# Patient Record
Sex: Female | Born: 1975 | Marital: Single | Smoking: Former smoker
Health system: Southern US, Community
[De-identification: ages and names within clinical notes are randomized; demographics above are authoritative.]

## PROBLEM LIST (undated history)

## (undated) DIAGNOSIS — K219 Gastro-esophageal reflux disease without esophagitis: Secondary | ICD-10-CM

## (undated) DIAGNOSIS — K589 Irritable bowel syndrome without diarrhea: Secondary | ICD-10-CM

## (undated) DIAGNOSIS — E785 Hyperlipidemia, unspecified: Secondary | ICD-10-CM

## (undated) DIAGNOSIS — T7840XA Allergy, unspecified, initial encounter: Secondary | ICD-10-CM

## (undated) HISTORY — PX: CHOLECYSTECTOMY: SHX55

## (undated) HISTORY — PX: SEPTOPLASTY: SUR1290

## (undated) HISTORY — DX: Gastro-esophageal reflux disease without esophagitis: K21.9

## (undated) HISTORY — PX: OTHER SURGICAL HISTORY: SHX169

## (undated) HISTORY — DX: Allergy, unspecified, initial encounter: T78.40XA

## (undated) HISTORY — PX: ABDOMINAL HYSTERECTOMY: SHX81

## (undated) HISTORY — DX: Irritable bowel syndrome, unspecified: K58.9

## (undated) HISTORY — PX: EYE SURGERY: SHX253

## (undated) HISTORY — DX: Hyperlipidemia, unspecified: E78.5

---

## 2012-05-11 DIAGNOSIS — T2610XA Burn of cornea and conjunctival sac, unspecified eye, initial encounter: Secondary | ICD-10-CM | POA: Insufficient documentation

## 2012-05-11 DIAGNOSIS — T2600XA Burn of unspecified eyelid and periocular area, initial encounter: Secondary | ICD-10-CM | POA: Insufficient documentation

## 2012-06-17 DIAGNOSIS — H18891 Other specified disorders of cornea, right eye: Secondary | ICD-10-CM | POA: Insufficient documentation

## 2012-08-25 DIAGNOSIS — H02059 Trichiasis without entropian unspecified eye, unspecified eyelid: Secondary | ICD-10-CM | POA: Insufficient documentation

## 2013-07-17 DIAGNOSIS — H179 Unspecified corneal scar and opacity: Secondary | ICD-10-CM | POA: Insufficient documentation

## 2013-07-17 DIAGNOSIS — H11811 Pseudopterygium of conjunctiva, right eye: Secondary | ICD-10-CM | POA: Insufficient documentation

## 2013-07-17 DIAGNOSIS — H11239 Symblepharon, unspecified eye: Secondary | ICD-10-CM | POA: Insufficient documentation

## 2013-11-18 DIAGNOSIS — K219 Gastro-esophageal reflux disease without esophagitis: Secondary | ICD-10-CM | POA: Insufficient documentation

## 2013-12-19 DIAGNOSIS — H02019 Cicatricial entropion of unspecified eye, unspecified eyelid: Secondary | ICD-10-CM | POA: Insufficient documentation

## 2016-11-07 DIAGNOSIS — M79671 Pain in right foot: Secondary | ICD-10-CM | POA: Insufficient documentation

## 2016-11-07 DIAGNOSIS — M722 Plantar fascial fibromatosis: Secondary | ICD-10-CM | POA: Insufficient documentation

## 2016-11-07 DIAGNOSIS — M24571 Contracture, right ankle: Secondary | ICD-10-CM | POA: Insufficient documentation

## 2018-04-29 ENCOUNTER — Telehealth: Payer: Self-pay | Admitting: Gastroenterology

## 2018-04-30 NOTE — Telephone Encounter (Signed)
I cant find in [facility] where you gave him this medication. Would you like to give refills?

## 2018-05-01 MED ORDER — DEXLANSOPRAZOLE 60 MG PO CPDR: 60.0000 mg | DELAYED_RELEASE_CAPSULE | Freq: Every day | ORAL | 3 refills | Status: DC

## 2018-05-01 NOTE — Telephone Encounter (Signed)
Sent refills to patients pharmacy.  

## 2018-05-01 NOTE — Telephone Encounter (Signed)
Please fill and Dexilant 60 mg p.o. once a day for 90 days 4 refills

## 2018-08-07 ENCOUNTER — Telehealth: Payer: Self-pay | Admitting: Gastroenterology

## 2018-08-08 NOTE — Telephone Encounter (Signed)
I have submitted a prior authorization on this medication.

## 2019-05-09 ENCOUNTER — Other Ambulatory Visit: Payer: Self-pay | Admitting: Gastroenterology

## 2019-08-12 ENCOUNTER — Telehealth: Payer: Self-pay | Admitting: Gastroenterology

## 2019-08-12 NOTE — Telephone Encounter (Signed)
I have called patient and ask her to have the pharmacy send [place] a prior authorization since she has not seen Dr. Lyndel Safe since 2018 we do not have her current insurance information. I did make patient a follow up appointment to get additional refills.

## 2019-08-14 ENCOUNTER — Telehealth: Payer: Self-pay | Admitting: Gastroenterology

## 2019-08-17 NOTE — Telephone Encounter (Signed)
See other telephone note.  

## 2019-08-17 NOTE — Telephone Encounter (Signed)
I have called patient and let her know that I have submitted the prior authorization for her Dexilant and we are just waiting to hear back from the insurance.

## 2019-08-18 ENCOUNTER — Telehealth: Payer: Self-pay

## 2019-08-18 NOTE — Telephone Encounter (Signed)
[employer] [place] approved Dexilant 60mg  from 08/14/2019-08/13/2020. Certification number [id number]

## 2019-08-31 ENCOUNTER — Encounter: Payer: Self-pay | Admitting: Gastroenterology

## 2019-08-31 ENCOUNTER — Ambulatory Visit (INDEPENDENT_AMBULATORY_CARE_PROVIDER_SITE_OTHER): Payer: BC Managed Care – PPO | Admitting: Gastroenterology

## 2019-08-31 ENCOUNTER — Other Ambulatory Visit: Payer: Self-pay

## 2019-08-31 VITALS — BP 132/82 | HR 97 | Temp 98.1°F | Ht 64.0 in | Wt 240.4 lb

## 2019-08-31 DIAGNOSIS — K219 Gastro-esophageal reflux disease without esophagitis: Secondary | ICD-10-CM

## 2019-08-31 NOTE — Patient Instructions (Signed)
If you are age 41 or older, your body mass index should be between 23-30. Your Body mass index is 41.26 kg/m. If this is out of the aforementioned range listed, please consider follow up with your Primary Care Provider.  If you are age 68 or younger, your body mass index should be between 19-25. Your Body mass index is 41.26 kg/m. If this is out of the aformentioned range listed, please consider follow up with your Primary Care Provider.   We have sent the following medications to your pharmacy for you to pick up at your convenience: Dexilant   Try to lose 10 lbs in the next 12 weeks   It has been recommend that you get a Primary Care Provider.  Dr Lyndel Safe recommends that you follow up with Dr. Nani Ravens. Phone: [phone]  Thank you,  Dr. Jackquline Denmark

## 2019-08-31 NOTE — Progress Notes (Signed)
Chief Complaint:   Referring Provider:  No ref. provider found      ASSESSMENT AND PLAN;   #1. GERD with HH, failed omeprazole, Nexium.  #2. IBS with alt diarrhea/constipation. Element of postcholecystectomy diarrhea.  Plan; -Dexilant 60mg  po qd, #90, 4 refills -She has gained weight.  I encouraged her to lose 10lb in next 12 weeks.  More grilled foods rather than fried foods.  Start exercising as well. -She is going to watch her diet and determine if she is allergic to peanuts or not. -Dr Nani Ravens for PCP and to get established/routine blood work. -FU in 12 weeks.  Earlier in case of any problems.   HPI:    Nicole Atkinson is a 43 y.o. female  For follow-up visit.  Here for medication refill.  Nicole Atkinson working well.  If she misses even a single dose, she starts having increasing reflux symptoms.  No odynophagia or dysphagia.  She has tried to cut down on medicines but has not been able to.  No nausea, vomiting, heartburn, regurgitation, odynophagia or dysphagia.  Has alternating diarrhea and constipation with abdominal bloating and lower abdominal discomfort which gets better with defecation.  Dx with IBS in the past.  Her older daughter also has IBS with predominant constipation. There is no melena or hematochezia. No unintentional weight loss.  No fever chills or night sweats.  No nocturnal symptoms.  Always has diarrhea if she eats at [place].  Concerned about peanut allergies.  No shortness of breath/skin rash after eating peanuts.  So no anaphylaxis or immediate hypersensitivity.  I have discussed long-term side effects of PPIs with the patient.  She is doing so well from a GI standpoint that she does not want to come off or decrease dose currently.  Past GI procedures: -EGD 06/2013 3 cm HH -colon 03/2006-neg with neg random colonic Bx Past Medical History:  Diagnosis Date  . GERD (gastroesophageal reflux disease)   . IBS (irritable bowel syndrome)     Past  Surgical History:  Procedure Laterality Date  . ABDOMINAL HYSTERECTOMY    . CHOLECYSTECTOMY    . COLONOSCOPY  03/18/2006   Minimal internal hemorrhoids. Otherwise normal colonoscopy to terminal ileum   . ESOPHAGOGASTRODUODENOSCOPY  06/17/2013   Hiatal Hernia. Irregular Z-line suggestive of gastroesophageal reflux (biopsied).   . EYE SURGERY    . lscs     x3  . SEPTOPLASTY    . tummy tuck       Family History  Problem Relation Age of Onset  . Colon cancer Neg Hx   . Esophageal cancer Neg Hx     Social History   Tobacco Use  . Smoking status: Former Research scientist (life sciences)  . Smokeless tobacco: Never Used  Substance Use Topics  . Alcohol use: Yes    Comment: ocassionally/once a week  . Drug use: Not Currently    Current Outpatient Medications  Medication Sig Dispense Refill  . DEXILANT 60 MG capsule TAKE 1 CAPSULE BY MOUTH EVERY DAY 90 capsule 3   No current facility-administered medications for this visit.     Allergies  Allergen Reactions  . Hydromorphone Itching    Intolerant to PO 01/24/15 Tolerated IV well yesterday 01/24/15. Intolerant to PO 01/24/15 Tolerated IV well yesterday 01/24/15.   Marland Kitchen Oxycodone-Acetaminophen Itching and Hives    itching itching     Review of Systems:  Constitutional: Denies fever, chills, diaphoresis, appetite change and fatigue.  HEENT: Denies photophobia, eye pain, redness, hearing loss, ear pain,  congestion, sore throat, rhinorrhea, sneezing, mouth sores, neck pain, neck stiffness and tinnitus.   Respiratory: Denies SOB, DOE, cough, chest tightness,  and wheezing.   Cardiovascular: Denies chest pain, palpitations and leg swelling.  Genitourinary: Denies dysuria, urgency, frequency, hematuria, flank pain and difficulty urinating.  Musculoskeletal: Denies myalgias, back pain, joint swelling, arthralgias and gait problem.  Skin: No rash.  Neurological: Denies dizziness, seizures, syncope, weakness, light-headedness, numbness and headaches.   Hematological: Denies adenopathy. Easy bruising, personal or family bleeding history  Psychiatric/Behavioral: No anxiety or depression     Physical Exam:    BP 132/82   Pulse 97   Temp 98.1 F (36.7 C)   Ht 5\' 4"  (1.626 m)   Wt 240 lb 6 oz (109 kg)   BMI 41.26 kg/m  Filed Weights   08/31/19 1457  Weight: 240 lb 6 oz (109 kg)   Constitutional:  Well-developed, in no acute distress. Psychiatric: Normal mood and affect. Behavior is normal. HEENT: Pupils normal.  Conjunctivae are normal. No scleral icterus. Neck supple.  Cardiovascular: Normal rate, regular rhythm. No edema Pulmonary/chest: Effort normal and breath sounds normal. No wheezing, rales or rhonchi. Abdominal: Soft, nondistended. Nontender. Bowel sounds active throughout. There are no masses palpable. No hepatomegaly. Rectal:  defered Neurological: Alert and oriented to person place and time. Skin: Skin is warm and dry. No rashes noted.  Data Reviewed: I have personally reviewed following labs and imaging studies 25 minutes spent with the patient today. Greater than 50% was spent in counseling and coordination of care with the patient   09/02/19, MD 08/31/2019, 3:06 PM  Cc: No ref. provider found

## 2019-11-12 NOTE — Telephone Encounter (Signed)
For the form with medical necessity  -We can fill at this time -Next time it has to come in from her primary care physician.  She should get one  Thx  RG

## 2019-11-12 NOTE — Telephone Encounter (Signed)
Bre,  Please write a letter stating that she has reflux with hiatal hernia and we have recommended her to lose weight.  FSA should cover that hopefully.  Thx  RG

## 2019-11-12 NOTE — Telephone Encounter (Signed)
Letter of medical necessity filled out and faxed to number listed on form-  Called and spoke with patient- informed her the paperwork had been faxed in - mailed to patient and copy sent to medical records for scanning;

## 2020-06-23 ENCOUNTER — Other Ambulatory Visit: Payer: Self-pay | Admitting: Gastroenterology

## 2020-08-09 ENCOUNTER — Telehealth: Payer: Self-pay | Admitting: Gastroenterology

## 2020-08-09 NOTE — Telephone Encounter (Signed)
Pt is scheduled for 10/03/2020.  Pt would like a refill on her dexilant

## 2020-08-10 MED ORDER — DEXILANT 60 MG PO CPDR: 1.0000 | DELAYED_RELEASE_CAPSULE | Freq: Every day | ORAL | 1 refills | Status: DC

## 2020-08-10 NOTE — Telephone Encounter (Signed)
I have sent prescription to patient's pharmacy.  

## 2020-10-03 ENCOUNTER — Encounter: Payer: Self-pay | Admitting: Gastroenterology

## 2020-10-03 ENCOUNTER — Ambulatory Visit: Payer: BC Managed Care – PPO | Admitting: Gastroenterology

## 2020-10-03 VITALS — BP 142/98 | HR 109 | Ht 64.0 in | Wt 265.5 lb

## 2020-10-03 DIAGNOSIS — K219 Gastro-esophageal reflux disease without esophagitis: Secondary | ICD-10-CM | POA: Diagnosis not present

## 2020-10-03 MED ORDER — DEXILANT 60 MG PO CPDR: 1.0000 | DELAYED_RELEASE_CAPSULE | Freq: Every day | ORAL | 4 refills | Status: DC

## 2020-10-03 NOTE — Patient Instructions (Signed)
If you are age 42 or older, your body mass index should be between 23-30. Your Body mass index is 45.57 kg/m. If this is out of the aforementioned range listed, please consider follow up with your Primary Care Provider.  If you are age 61 or younger, your body mass index should be between 19-25. Your Body mass index is 45.57 kg/m. If this is out of the aformentioned range listed, please consider follow up with your Primary Care Provider.   We have sent the following medications to your pharmacy for you to pick up at your convenience: Dexilant   Follow up in 1 year  Thank you,  Dr. Lynann Bologna
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
## 2020-10-06 DIAGNOSIS — M25632 Stiffness of left wrist, not elsewhere classified: Secondary | ICD-10-CM | POA: Diagnosis not present

## 2020-10-20 DIAGNOSIS — M25532 Pain in left wrist: Secondary | ICD-10-CM | POA: Diagnosis not present

## 2020-10-20 DIAGNOSIS — G8929 Other chronic pain: Secondary | ICD-10-CM | POA: Diagnosis not present

## 2020-10-25 DIAGNOSIS — S63592A Other specified sprain of left wrist, initial encounter: Secondary | ICD-10-CM | POA: Diagnosis not present

## 2020-11-08 DIAGNOSIS — M25532 Pain in left wrist: Secondary | ICD-10-CM | POA: Diagnosis not present

## 2020-11-08 DIAGNOSIS — M25632 Stiffness of left wrist, not elsewhere classified: Secondary | ICD-10-CM | POA: Diagnosis not present

## 2020-11-29 DIAGNOSIS — M19012 Primary osteoarthritis, left shoulder: Secondary | ICD-10-CM | POA: Diagnosis not present

## 2020-11-29 DIAGNOSIS — M7522 Bicipital tendinitis, left shoulder: Secondary | ICD-10-CM | POA: Diagnosis not present

## 2020-11-29 DIAGNOSIS — S46012A Strain of muscle(s) and tendon(s) of the rotator cuff of left shoulder, initial encounter: Secondary | ICD-10-CM | POA: Diagnosis not present

## 2020-11-29 DIAGNOSIS — M7542 Impingement syndrome of left shoulder: Secondary | ICD-10-CM | POA: Diagnosis not present

## 2020-12-08 DIAGNOSIS — M1812 Unilateral primary osteoarthritis of first carpometacarpal joint, left hand: Secondary | ICD-10-CM | POA: Diagnosis not present

## 2020-12-08 DIAGNOSIS — M25632 Stiffness of left wrist, not elsewhere classified: Secondary | ICD-10-CM | POA: Diagnosis not present

## 2021-01-05 DIAGNOSIS — M1812 Unilateral primary osteoarthritis of first carpometacarpal joint, left hand: Secondary | ICD-10-CM | POA: Diagnosis not present

## 2021-01-05 DIAGNOSIS — M25632 Stiffness of left wrist, not elsewhere classified: Secondary | ICD-10-CM | POA: Diagnosis not present

## 2021-07-06 DIAGNOSIS — M67912 Unspecified disorder of synovium and tendon, left shoulder: Secondary | ICD-10-CM | POA: Diagnosis not present

## 2021-07-06 DIAGNOSIS — M25512 Pain in left shoulder: Secondary | ICD-10-CM | POA: Diagnosis not present

## 2021-07-24 DIAGNOSIS — M67912 Unspecified disorder of synovium and tendon, left shoulder: Secondary | ICD-10-CM | POA: Diagnosis not present

## 2021-08-02 DIAGNOSIS — L820 Inflamed seborrheic keratosis: Secondary | ICD-10-CM | POA: Diagnosis not present

## 2021-08-02 DIAGNOSIS — L739 Follicular disorder, unspecified: Secondary | ICD-10-CM | POA: Diagnosis not present

## 2021-08-02 DIAGNOSIS — L814 Other melanin hyperpigmentation: Secondary | ICD-10-CM | POA: Diagnosis not present

## 2021-08-02 DIAGNOSIS — L700 Acne vulgaris: Secondary | ICD-10-CM | POA: Diagnosis not present

## 2021-08-02 DIAGNOSIS — L578 Other skin changes due to chronic exposure to nonionizing radiation: Secondary | ICD-10-CM | POA: Diagnosis not present

## 2021-08-11 ENCOUNTER — Other Ambulatory Visit: Payer: Self-pay | Admitting: Orthopedic Surgery

## 2021-08-11 DIAGNOSIS — M25512 Pain in left shoulder: Secondary | ICD-10-CM

## 2021-08-11 DIAGNOSIS — M67912 Unspecified disorder of synovium and tendon, left shoulder: Secondary | ICD-10-CM

## 2021-08-15 ENCOUNTER — Other Ambulatory Visit: Payer: Self-pay | Admitting: Orthopedic Surgery

## 2021-08-15 DIAGNOSIS — M25562 Pain in left knee: Secondary | ICD-10-CM | POA: Diagnosis not present

## 2021-08-26 ENCOUNTER — Ambulatory Visit
Admission: RE | Admit: 2021-08-26 | Discharge: 2021-08-26 | Disposition: A | Discharge status: Home / Self Care | Payer: BC Managed Care – PPO | Source: Ambulatory Visit | Attending: Orthopedic Surgery | Admitting: Orthopedic Surgery

## 2021-08-26 ENCOUNTER — Other Ambulatory Visit: Payer: Self-pay

## 2021-08-26 DIAGNOSIS — M25512 Pain in left shoulder: Secondary | ICD-10-CM

## 2021-08-26 DIAGNOSIS — M67912 Unspecified disorder of synovium and tendon, left shoulder: Secondary | ICD-10-CM

## 2021-08-26 DIAGNOSIS — M25562 Pain in left knee: Secondary | ICD-10-CM | POA: Diagnosis not present

## 2021-08-26 DIAGNOSIS — S46012A Strain of muscle(s) and tendon(s) of the rotator cuff of left shoulder, initial encounter: Secondary | ICD-10-CM | POA: Diagnosis not present

## 2021-08-31 DIAGNOSIS — M25562 Pain in left knee: Secondary | ICD-10-CM | POA: Diagnosis not present

## 2021-08-31 DIAGNOSIS — M67912 Unspecified disorder of synovium and tendon, left shoulder: Secondary | ICD-10-CM | POA: Diagnosis not present

## 2021-10-04 ENCOUNTER — Other Ambulatory Visit: Payer: Self-pay | Admitting: Gastroenterology

## 2021-10-07 ENCOUNTER — Other Ambulatory Visit: Payer: Self-pay | Admitting: Gastroenterology

## 2021-10-09 ENCOUNTER — Telehealth: Payer: Self-pay | Admitting: Gastroenterology

## 2021-10-09 NOTE — Telephone Encounter (Signed)
PA was sent today actually. Im waiting for a response

## 2021-10-09 NOTE — Telephone Encounter (Signed)
Inbound call from patient states she need PA for Dexilant. States insurance says it have to be for after 07/10/2021

## 2021-10-10 NOTE — Telephone Encounter (Signed)
Spoke to [employer] and they denied her dexilant and told patient we will do patient assistance and I will work on her appeal and she voiced understanding.

## 2021-10-11 ENCOUNTER — Encounter: Payer: Self-pay | Admitting: General Surgery

## 2021-10-11 NOTE — Telephone Encounter (Signed)
Patient assistance sent to patient by mail and appeal sent to [phone] phone number [phone]

## 2021-10-12 DIAGNOSIS — M25512 Pain in left shoulder: Secondary | ICD-10-CM | POA: Diagnosis not present

## 2021-10-18 NOTE — Telephone Encounter (Signed)
Patient called requesting to speak with you regarding a denial letter she received.

## 2021-10-19 NOTE — Telephone Encounter (Signed)
Spoke with the patient last evening around 5:00 pm, she states that she received a denial from the insurance stating that an appeal needs to go to another place than we sent it. She was unable to provide an address as to where it needed to go. I did explain to her that we sent a letter and filled out an appeal. She will get back in contact with additional information on where to send it.

## 2021-11-29 DIAGNOSIS — M7542 Impingement syndrome of left shoulder: Secondary | ICD-10-CM | POA: Diagnosis not present

## 2021-11-29 DIAGNOSIS — G8918 Other acute postprocedural pain: Secondary | ICD-10-CM | POA: Diagnosis not present

## 2021-11-29 DIAGNOSIS — S43432A Superior glenoid labrum lesion of left shoulder, initial encounter: Secondary | ICD-10-CM | POA: Diagnosis not present

## 2021-11-29 DIAGNOSIS — M19012 Primary osteoarthritis, left shoulder: Secondary | ICD-10-CM | POA: Diagnosis not present

## 2021-11-29 DIAGNOSIS — M7522 Bicipital tendinitis, left shoulder: Secondary | ICD-10-CM | POA: Diagnosis not present

## 2021-11-29 DIAGNOSIS — S46012A Strain of muscle(s) and tendon(s) of the rotator cuff of left shoulder, initial encounter: Secondary | ICD-10-CM | POA: Diagnosis not present

## 2021-11-29 DIAGNOSIS — Y999 Unspecified external cause status: Secondary | ICD-10-CM | POA: Diagnosis not present

## 2021-11-29 DIAGNOSIS — X58XXXA Exposure to other specified factors, initial encounter: Secondary | ICD-10-CM | POA: Diagnosis not present

## 2021-11-29 DIAGNOSIS — M67814 Other specified disorders of tendon, left shoulder: Secondary | ICD-10-CM | POA: Diagnosis not present

## 2021-12-13 DIAGNOSIS — S46002D Unspecified injury of muscle(s) and tendon(s) of the rotator cuff of left shoulder, subsequent encounter: Secondary | ICD-10-CM | POA: Diagnosis not present

## 2021-12-13 DIAGNOSIS — M75122 Complete rotator cuff tear or rupture of left shoulder, not specified as traumatic: Secondary | ICD-10-CM | POA: Diagnosis not present

## 2021-12-18 DIAGNOSIS — S46002D Unspecified injury of muscle(s) and tendon(s) of the rotator cuff of left shoulder, subsequent encounter: Secondary | ICD-10-CM | POA: Diagnosis not present

## 2021-12-18 DIAGNOSIS — M75122 Complete rotator cuff tear or rupture of left shoulder, not specified as traumatic: Secondary | ICD-10-CM | POA: Diagnosis not present

## 2021-12-20 DIAGNOSIS — M75122 Complete rotator cuff tear or rupture of left shoulder, not specified as traumatic: Secondary | ICD-10-CM | POA: Diagnosis not present

## 2021-12-20 DIAGNOSIS — S46002D Unspecified injury of muscle(s) and tendon(s) of the rotator cuff of left shoulder, subsequent encounter: Secondary | ICD-10-CM | POA: Diagnosis not present

## 2021-12-22 DIAGNOSIS — M75122 Complete rotator cuff tear or rupture of left shoulder, not specified as traumatic: Secondary | ICD-10-CM | POA: Diagnosis not present

## 2021-12-22 DIAGNOSIS — S46002D Unspecified injury of muscle(s) and tendon(s) of the rotator cuff of left shoulder, subsequent encounter: Secondary | ICD-10-CM | POA: Diagnosis not present

## 2021-12-25 DIAGNOSIS — M75122 Complete rotator cuff tear or rupture of left shoulder, not specified as traumatic: Secondary | ICD-10-CM | POA: Diagnosis not present

## 2021-12-25 DIAGNOSIS — S46002D Unspecified injury of muscle(s) and tendon(s) of the rotator cuff of left shoulder, subsequent encounter: Secondary | ICD-10-CM | POA: Diagnosis not present

## 2021-12-29 DIAGNOSIS — M75122 Complete rotator cuff tear or rupture of left shoulder, not specified as traumatic: Secondary | ICD-10-CM | POA: Diagnosis not present

## 2021-12-29 DIAGNOSIS — S46002D Unspecified injury of muscle(s) and tendon(s) of the rotator cuff of left shoulder, subsequent encounter: Secondary | ICD-10-CM | POA: Diagnosis not present

## 2022-01-03 DIAGNOSIS — S46002D Unspecified injury of muscle(s) and tendon(s) of the rotator cuff of left shoulder, subsequent encounter: Secondary | ICD-10-CM | POA: Diagnosis not present

## 2022-01-03 DIAGNOSIS — M75122 Complete rotator cuff tear or rupture of left shoulder, not specified as traumatic: Secondary | ICD-10-CM | POA: Diagnosis not present

## 2022-01-05 DIAGNOSIS — M75122 Complete rotator cuff tear or rupture of left shoulder, not specified as traumatic: Secondary | ICD-10-CM | POA: Diagnosis not present

## 2022-01-05 DIAGNOSIS — S46002D Unspecified injury of muscle(s) and tendon(s) of the rotator cuff of left shoulder, subsequent encounter: Secondary | ICD-10-CM | POA: Diagnosis not present

## 2022-01-09 DIAGNOSIS — S46002D Unspecified injury of muscle(s) and tendon(s) of the rotator cuff of left shoulder, subsequent encounter: Secondary | ICD-10-CM | POA: Diagnosis not present

## 2022-01-09 DIAGNOSIS — M75122 Complete rotator cuff tear or rupture of left shoulder, not specified as traumatic: Secondary | ICD-10-CM | POA: Diagnosis not present

## 2022-01-11 DIAGNOSIS — M75122 Complete rotator cuff tear or rupture of left shoulder, not specified as traumatic: Secondary | ICD-10-CM | POA: Diagnosis not present

## 2022-01-11 DIAGNOSIS — S46002D Unspecified injury of muscle(s) and tendon(s) of the rotator cuff of left shoulder, subsequent encounter: Secondary | ICD-10-CM | POA: Diagnosis not present

## 2022-01-16 DIAGNOSIS — S46002D Unspecified injury of muscle(s) and tendon(s) of the rotator cuff of left shoulder, subsequent encounter: Secondary | ICD-10-CM | POA: Diagnosis not present

## 2022-01-16 DIAGNOSIS — M75122 Complete rotator cuff tear or rupture of left shoulder, not specified as traumatic: Secondary | ICD-10-CM | POA: Diagnosis not present

## 2022-01-23 DIAGNOSIS — M75122 Complete rotator cuff tear or rupture of left shoulder, not specified as traumatic: Secondary | ICD-10-CM | POA: Diagnosis not present

## 2022-01-23 DIAGNOSIS — S46002D Unspecified injury of muscle(s) and tendon(s) of the rotator cuff of left shoulder, subsequent encounter: Secondary | ICD-10-CM | POA: Diagnosis not present

## 2022-01-25 DIAGNOSIS — M75122 Complete rotator cuff tear or rupture of left shoulder, not specified as traumatic: Secondary | ICD-10-CM | POA: Diagnosis not present

## 2022-01-25 DIAGNOSIS — S46002D Unspecified injury of muscle(s) and tendon(s) of the rotator cuff of left shoulder, subsequent encounter: Secondary | ICD-10-CM | POA: Diagnosis not present

## 2022-01-30 DIAGNOSIS — S46002D Unspecified injury of muscle(s) and tendon(s) of the rotator cuff of left shoulder, subsequent encounter: Secondary | ICD-10-CM | POA: Diagnosis not present

## 2022-01-30 DIAGNOSIS — M75122 Complete rotator cuff tear or rupture of left shoulder, not specified as traumatic: Secondary | ICD-10-CM | POA: Diagnosis not present

## 2022-02-06 DIAGNOSIS — M75122 Complete rotator cuff tear or rupture of left shoulder, not specified as traumatic: Secondary | ICD-10-CM | POA: Diagnosis not present

## 2022-02-06 DIAGNOSIS — S46002D Unspecified injury of muscle(s) and tendon(s) of the rotator cuff of left shoulder, subsequent encounter: Secondary | ICD-10-CM | POA: Diagnosis not present

## 2022-02-08 DIAGNOSIS — M75122 Complete rotator cuff tear or rupture of left shoulder, not specified as traumatic: Secondary | ICD-10-CM | POA: Diagnosis not present

## 2022-02-08 DIAGNOSIS — S46002D Unspecified injury of muscle(s) and tendon(s) of the rotator cuff of left shoulder, subsequent encounter: Secondary | ICD-10-CM | POA: Diagnosis not present

## 2022-02-13 DIAGNOSIS — S46002D Unspecified injury of muscle(s) and tendon(s) of the rotator cuff of left shoulder, subsequent encounter: Secondary | ICD-10-CM | POA: Diagnosis not present

## 2022-02-13 DIAGNOSIS — M75122 Complete rotator cuff tear or rupture of left shoulder, not specified as traumatic: Secondary | ICD-10-CM | POA: Diagnosis not present

## 2022-02-15 DIAGNOSIS — S46002D Unspecified injury of muscle(s) and tendon(s) of the rotator cuff of left shoulder, subsequent encounter: Secondary | ICD-10-CM | POA: Diagnosis not present

## 2022-02-15 DIAGNOSIS — M75122 Complete rotator cuff tear or rupture of left shoulder, not specified as traumatic: Secondary | ICD-10-CM | POA: Diagnosis not present

## 2022-02-28 DIAGNOSIS — S46002D Unspecified injury of muscle(s) and tendon(s) of the rotator cuff of left shoulder, subsequent encounter: Secondary | ICD-10-CM | POA: Diagnosis not present

## 2022-02-28 DIAGNOSIS — M75122 Complete rotator cuff tear or rupture of left shoulder, not specified as traumatic: Secondary | ICD-10-CM | POA: Diagnosis not present

## 2022-05-10 DIAGNOSIS — M67912 Unspecified disorder of synovium and tendon, left shoulder: Secondary | ICD-10-CM | POA: Diagnosis not present

## 2022-07-23 DIAGNOSIS — R600 Localized edema: Secondary | ICD-10-CM | POA: Diagnosis not present

## 2022-08-01 DIAGNOSIS — J019 Acute sinusitis, unspecified: Secondary | ICD-10-CM | POA: Diagnosis not present

## 2022-08-01 DIAGNOSIS — B9689 Other specified bacterial agents as the cause of diseases classified elsewhere: Secondary | ICD-10-CM | POA: Diagnosis not present

## 2022-09-17 DIAGNOSIS — Z90710 Acquired absence of both cervix and uterus: Secondary | ICD-10-CM | POA: Diagnosis not present

## 2022-09-17 DIAGNOSIS — Z6841 Body Mass Index (BMI) 40.0 and over, adult: Secondary | ICD-10-CM | POA: Diagnosis not present

## 2022-09-17 DIAGNOSIS — K2100 Gastro-esophageal reflux disease with esophagitis, without bleeding: Secondary | ICD-10-CM | POA: Diagnosis not present

## 2022-09-17 DIAGNOSIS — Z1389 Encounter for screening for other disorder: Secondary | ICD-10-CM | POA: Diagnosis not present

## 2022-09-17 DIAGNOSIS — Z0000 Encounter for general adult medical examination without abnormal findings: Secondary | ICD-10-CM | POA: Diagnosis not present

## 2022-12-20 ENCOUNTER — Other Ambulatory Visit: Payer: Self-pay | Admitting: Gastroenterology

## 2022-12-28 DIAGNOSIS — E7800 Pure hypercholesterolemia, unspecified: Secondary | ICD-10-CM | POA: Diagnosis not present

## 2022-12-28 DIAGNOSIS — Z713 Dietary counseling and surveillance: Secondary | ICD-10-CM | POA: Diagnosis not present

## 2022-12-28 DIAGNOSIS — R739 Hyperglycemia, unspecified: Secondary | ICD-10-CM | POA: Diagnosis not present

## 2022-12-28 DIAGNOSIS — K2100 Gastro-esophageal reflux disease with esophagitis, without bleeding: Secondary | ICD-10-CM | POA: Diagnosis not present

## 2023-01-02 DIAGNOSIS — J069 Acute upper respiratory infection, unspecified: Secondary | ICD-10-CM | POA: Diagnosis not present

## 2023-01-22 DIAGNOSIS — J0100 Acute maxillary sinusitis, unspecified: Secondary | ICD-10-CM | POA: Diagnosis not present

## 2023-02-20 DIAGNOSIS — Z6841 Body Mass Index (BMI) 40.0 and over, adult: Secondary | ICD-10-CM | POA: Diagnosis not present

## 2023-02-20 DIAGNOSIS — F5081 Binge eating disorder: Secondary | ICD-10-CM | POA: Diagnosis not present

## 2023-03-21 DIAGNOSIS — K219 Gastro-esophageal reflux disease without esophagitis: Secondary | ICD-10-CM | POA: Diagnosis not present

## 2023-03-21 DIAGNOSIS — F5081 Binge eating disorder: Secondary | ICD-10-CM | POA: Diagnosis not present

## 2023-03-21 DIAGNOSIS — Z6841 Body Mass Index (BMI) 40.0 and over, adult: Secondary | ICD-10-CM | POA: Diagnosis not present

## 2023-03-31 IMAGING — MR MR SHOULDER*L* W/O CM
6 series · 40 of 40 positions shown · non-contrast
Comparison: Left shoulder MRI 02/16/2014

CLINICAL DATA: Left shoulder pain

EXAM:
MRI OF THE LEFT SHOULDER WITHOUT CONTRAST
TECHNIQUE: Multiplanar, multisequence MR imaging of the shoulder was performed.
No intravenous contrast was administered.

[Series 3: T2 fat-sat · axial · 4.0mm · 0.29mm/px · z∈[-107,+34]mm · 7 of 30 slices shown (1 of 4)]
[im 1/30]
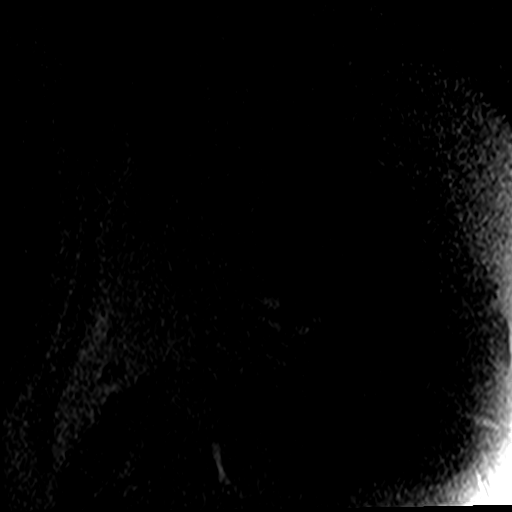
[im 5/30]
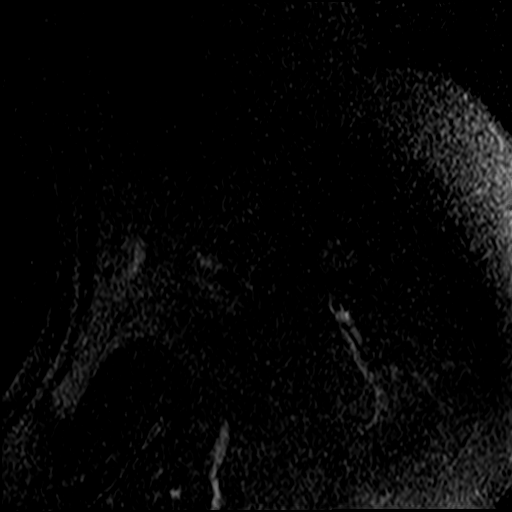
[im 10/30]
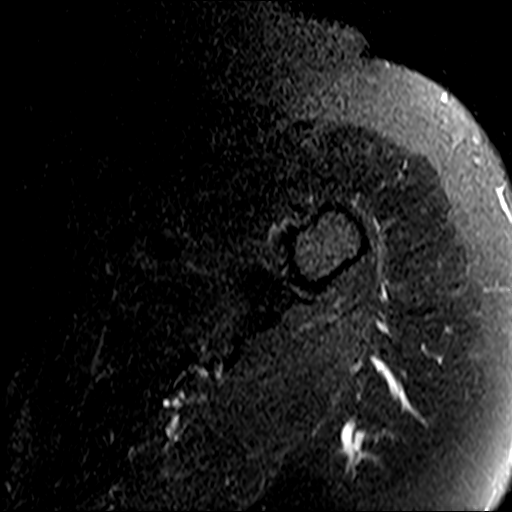
[im 15/30]
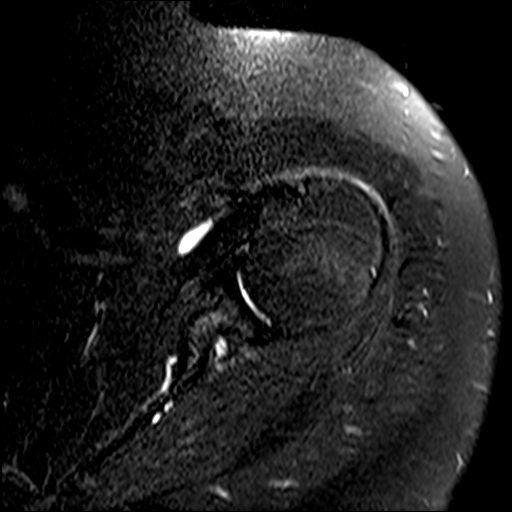
[im 20/30]
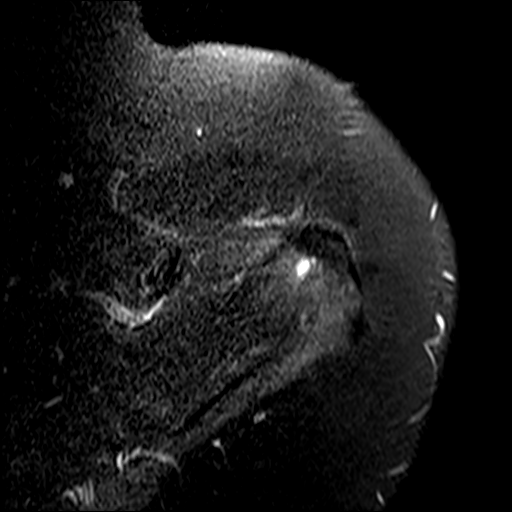
[im 25/30]
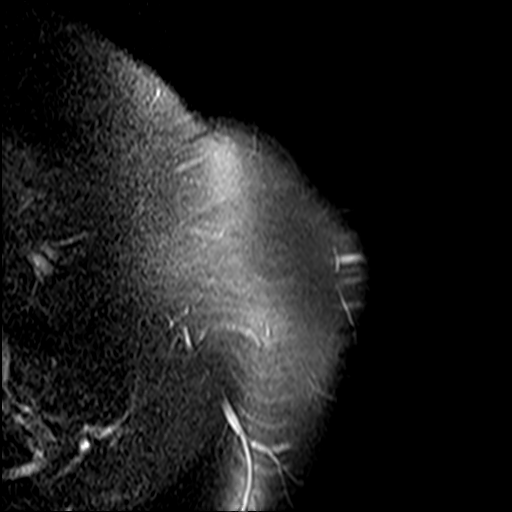
[im 30/30]
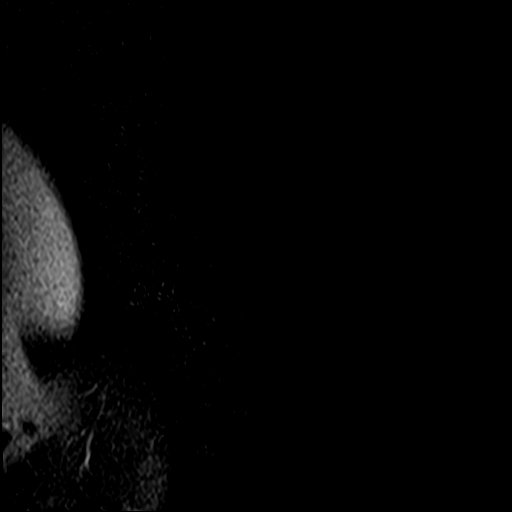

[Series 4: T2 fat-sat · oblique · 4.0mm · 0.62mm/px · 6 of 23 slices shown (2 of 4)]
[im 1/23]
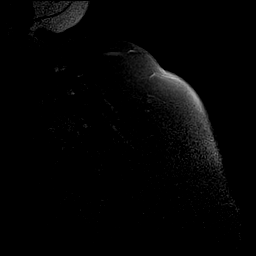
[im 5/23]
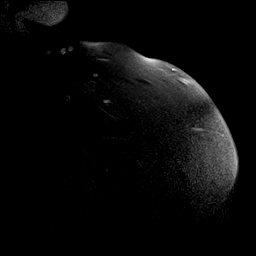
[im 9/23]
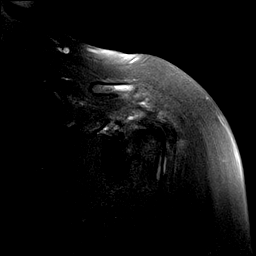
[im 14/23]
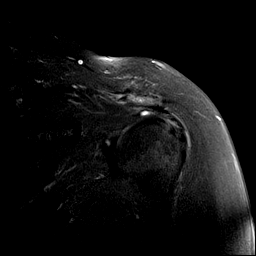
[im 18/23]
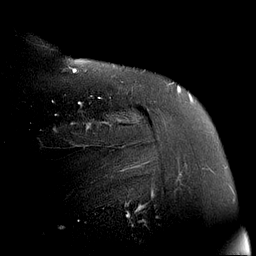
[im 23/23]
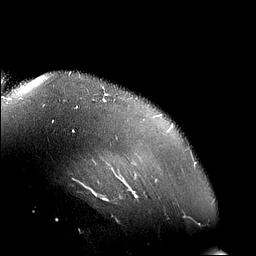

[Series 5: PD · oblique · 4.0mm · 0.62mm/px · 6 of 23 slices shown]
[im 1/23]
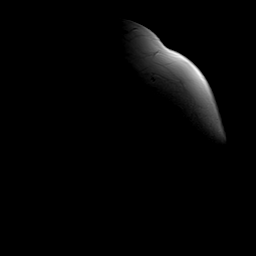
[im 5/23]
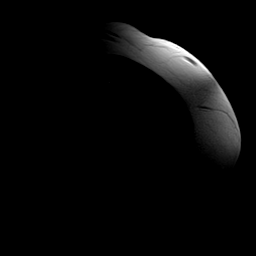
[im 9/23]
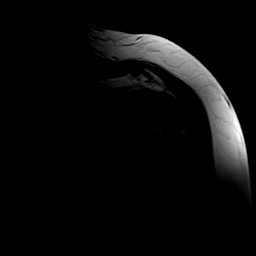
[im 14/23]
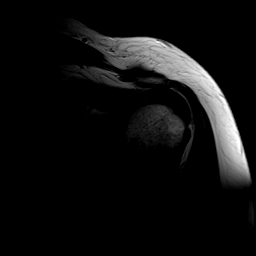
[im 18/23]
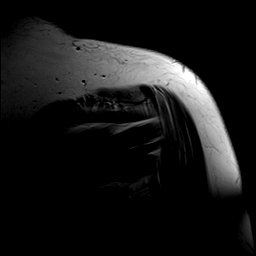
[im 23/23]
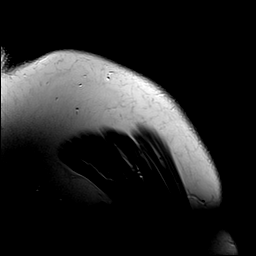

[Series 6: T2 fat-sat · oblique · 4.0mm · 0.62mm/px · 7 of 27 slices shown (3 of 4)]
[im 1/27]
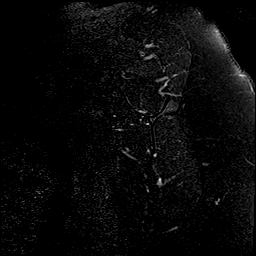
[im 5/27]
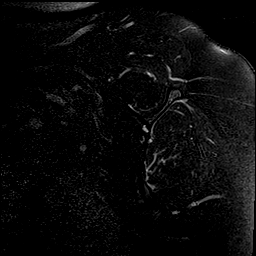
[im 9/27]
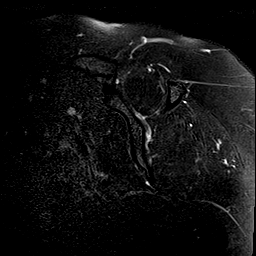
[im 14/27]
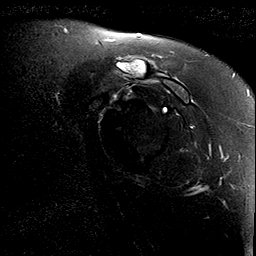
[im 18/27]
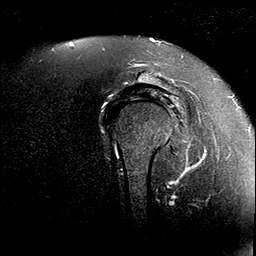
[im 22/27]
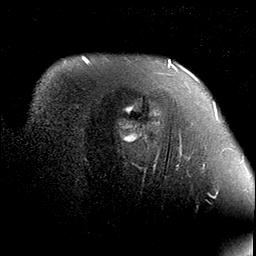
[im 27/27]
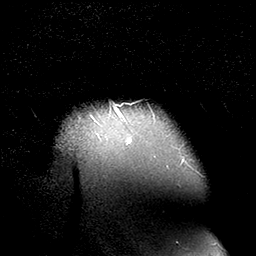

[Series 7: T1 · oblique · 4.0mm · 0.62mm/px · 7 of 27 slices shown]
[im 1/27]
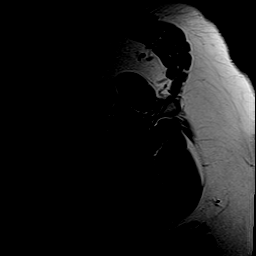
[im 5/27]
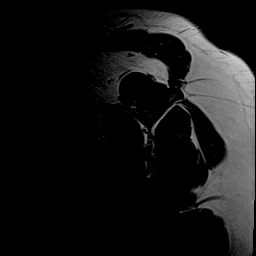
[im 9/27]
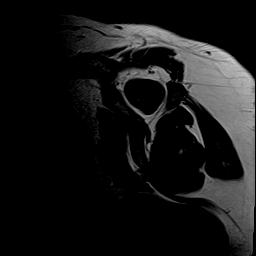
[im 14/27]
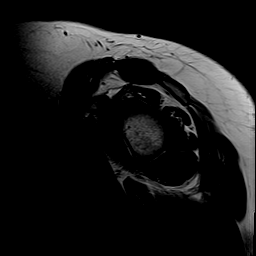
[im 18/27]
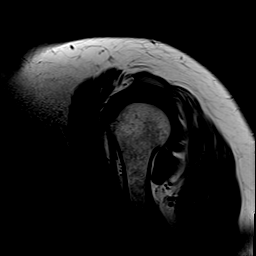
[im 22/27]
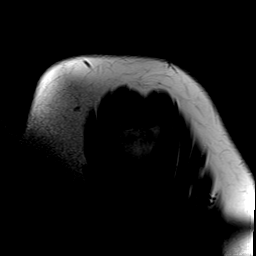
[im 27/27]
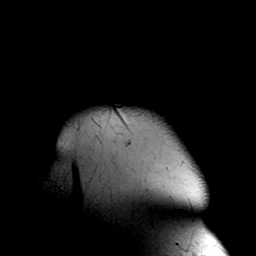

[Series 8: T2 fat-sat · oblique · 4.0mm · 0.62mm/px · 7 of 27 slices shown (4 of 4)]
[im 1/27]
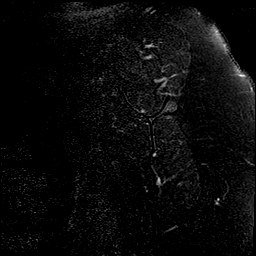
[im 5/27]
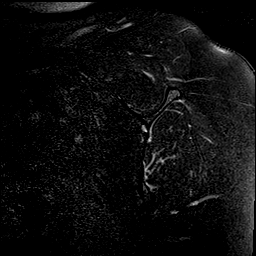
[im 9/27]
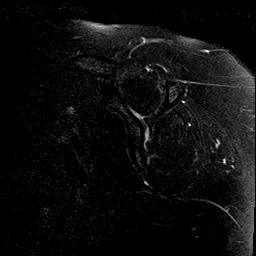
[im 14/27]
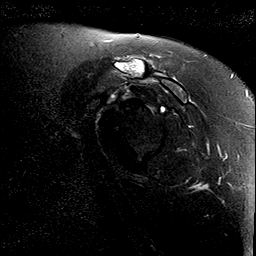
[im 18/27]
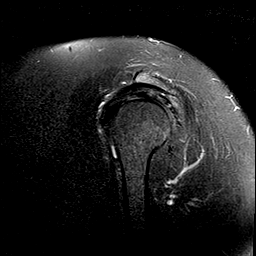
[im 22/27]
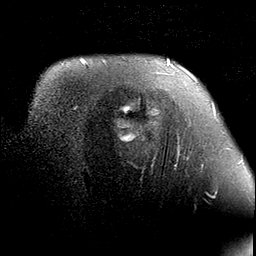
[im 27/27]
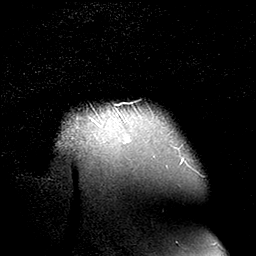

[40 of 40 positions shown; findings below may reference images not displayed]

FINDINGS: Rotator cuff: Distal supraspinatus tendinosis. There is a
full-thickness, partial width tear of the supraspinatus tendon at
the footprint with approximately 0.7 cm retraction. The width of the
tear measures approximately 0.9 cm. Tendinosis of the anterior
infraspinatus tendon with low-grade interstitial tearing along the
myotendinous junction. Teres minor tendon is intact. Subscapularis
tendon is intact.

Muscles: No significant muscle atrophy or edema.

Biceps Long Head: Intraarticular and extraarticular portions of the
biceps tendon are intact.

Acromioclavicular Joint: Moderate arthropathy of the
acromioclavicular joint, with periarticular bony edema in the distal
clavicle and acromion. Small amount of subacromial/subdeltoid bursal
fluid related to the focal full-thickness cuff tear.

Glenohumeral Joint: No significant joint effusion.  Mild chondrosis.

Labrum: Grossly intact, but evaluation is limited by lack of
intraarticular fluid/contrast.

Bones: No acute fracture or dislocation. No aggressive osseous
lesion. Paratracheal bony edema at the AC joint and the distal
clavicle and acromion.

Other: No fluid collection or hematoma.
IMPRESSION: Supraspinatus tendinosis with focal, partial width full-thickness
tear at the footprint with 0.7 cm retraction. Tendinosis of the
anterior infraspinatus tendon. No significant muscle atrophy.

Moderate AC joint arthropathy with periarticular bony edema like
related to arthritis, but can also be seen with acute low-grade AC
joint injury.

## 2023-03-31 IMAGING — MR MR KNEE*L* W/O CM
4 of 6 series · 23 of 40 positions shown · non-contrast
Comparison: None.

CLINICAL DATA: Left knee pain

EXAM:
MRI OF THE LEFT KNEE WITHOUT CONTRAST
TECHNIQUE: Multiplanar, multisequence MR imaging of the knee was performed. No
intravenous contrast was administered.

[Series 3: T2 fat-sat · axial · 4.0mm · 0.66mm/px · z∈[-49,+56]mm · 5 of 22 slices shown (1 of 2)]
[im 1/22]
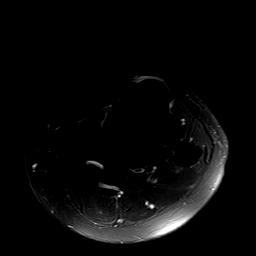
[im 6/22]
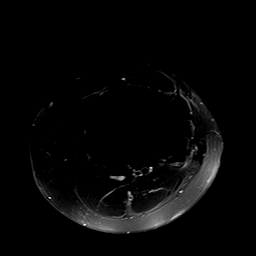
[im 11/22]
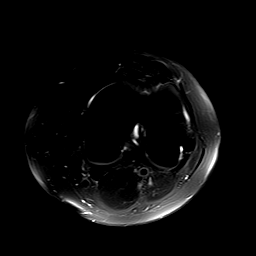
[im 16/22]
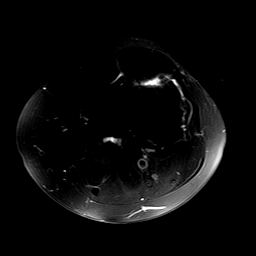
[im 22/22]
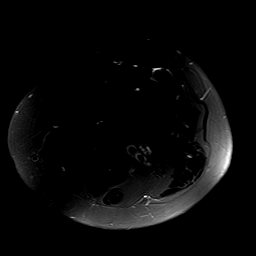

[Series 6: T2 fat-sat · coronal · 4.0mm · 0.29mm/px · 4 of 26 slices shown (2 of 2)]
[im 1/26]
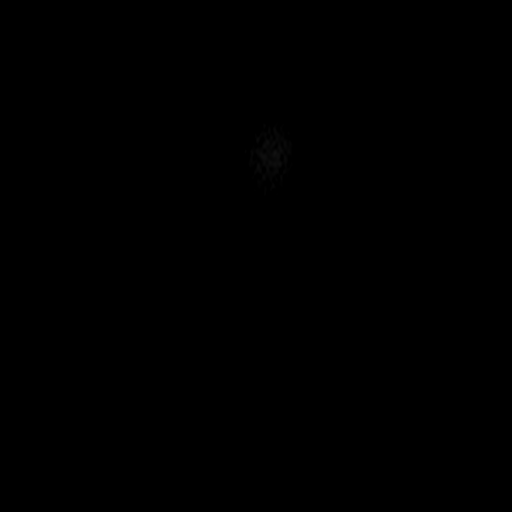
[im 5/26]
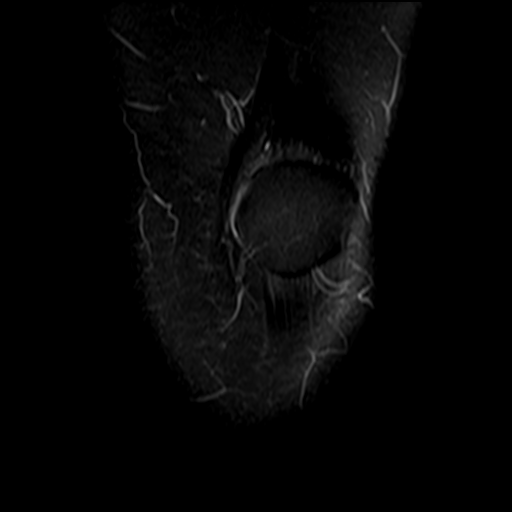
[im 13/26]
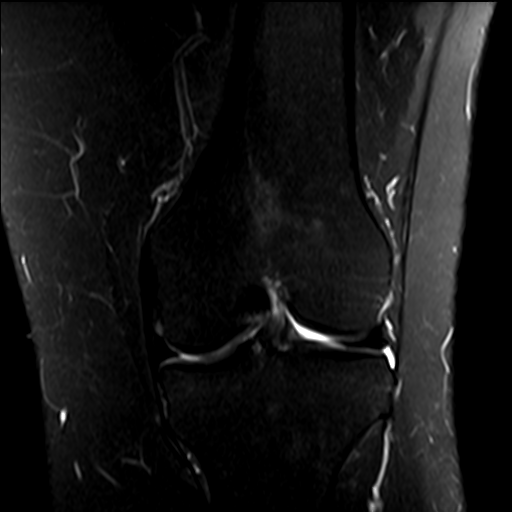
[im 21/26]
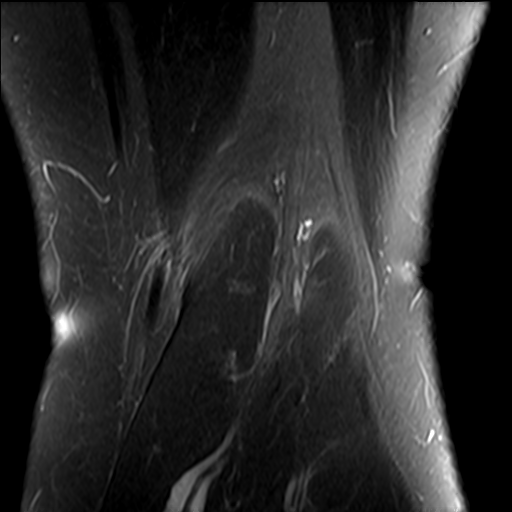

[Series 7: PD fat-sat · coronal · 4.0mm · 0.29mm/px · 7 of 26 slices shown (1 of 2)]
[im 1/26]
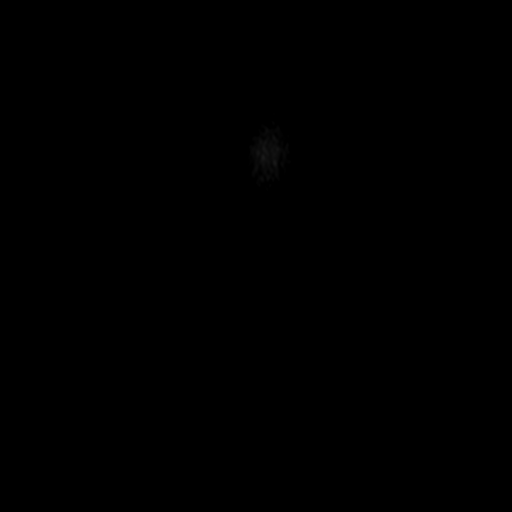
[im 5/26]
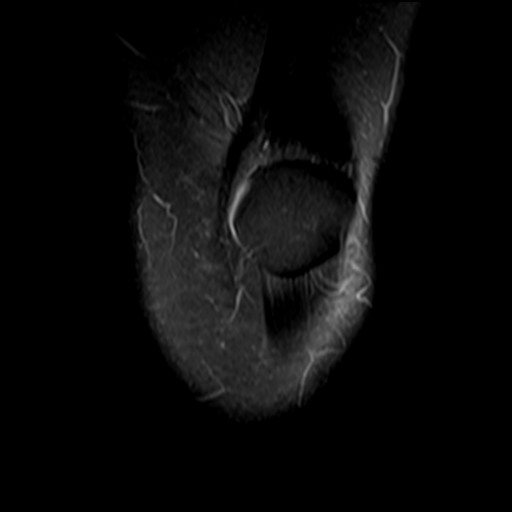
[im 9/26]
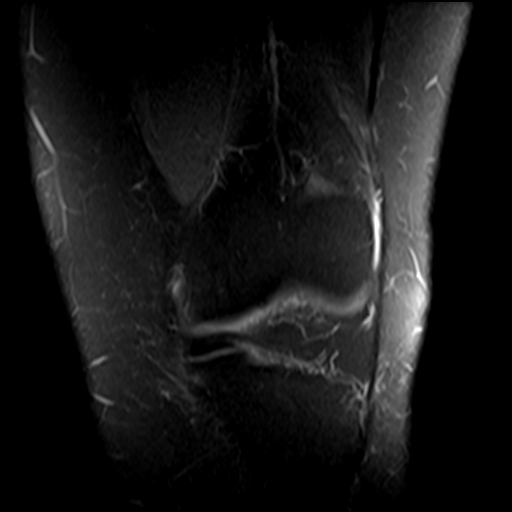
[im 13/26]
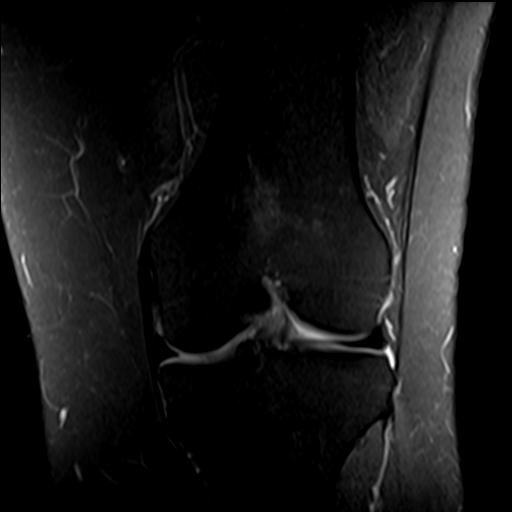
[im 17/26]
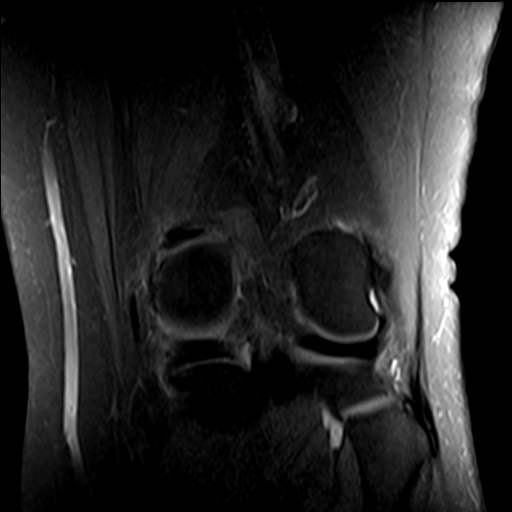
[im 21/26]
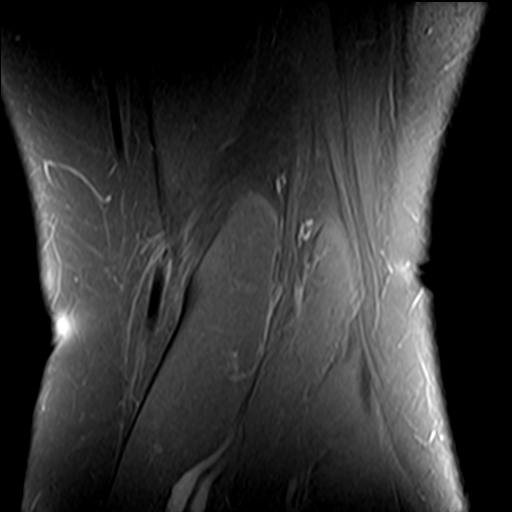
[im 26/26]
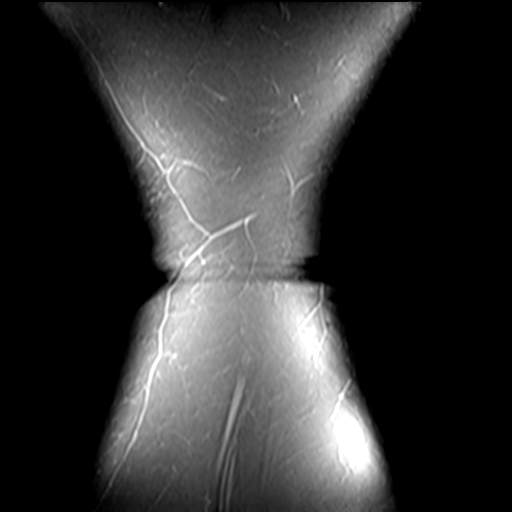

[Series 9: PD fat-sat · sagittal · 3.0mm · 0.29mm/px · 7 of 29 slices shown (2 of 2)]
[im 1/29]
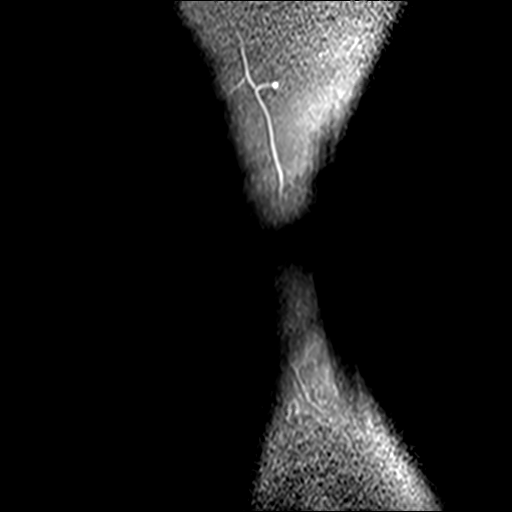
[im 5/29]
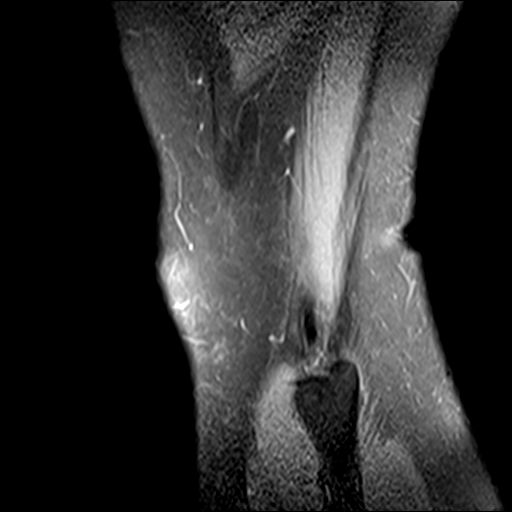
[im 10/29]
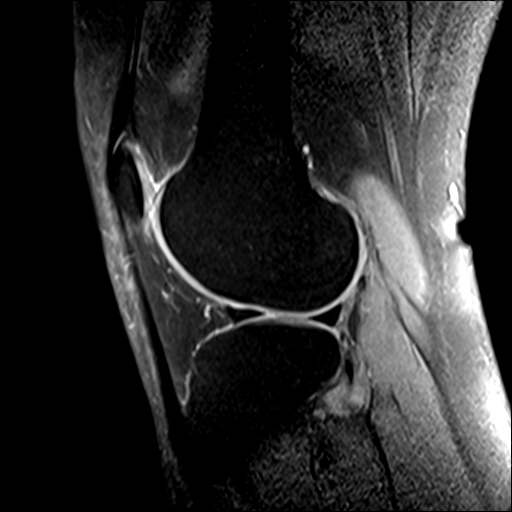
[im 15/29]
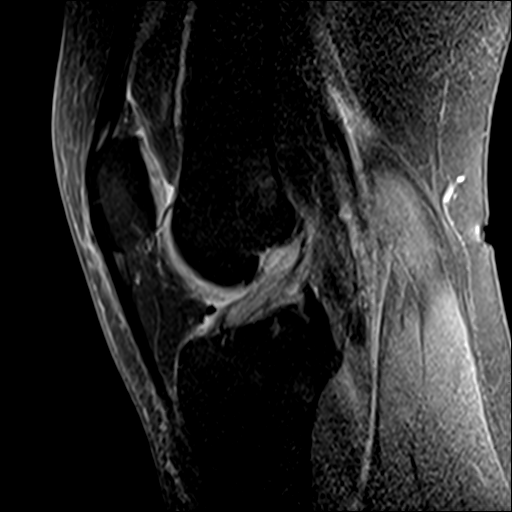
[im 19/29]
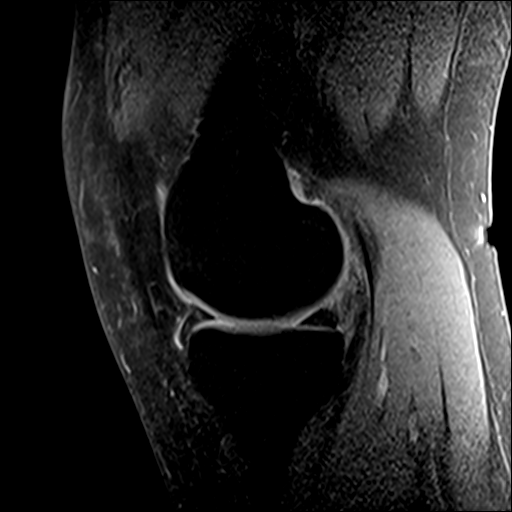
[im 24/29]
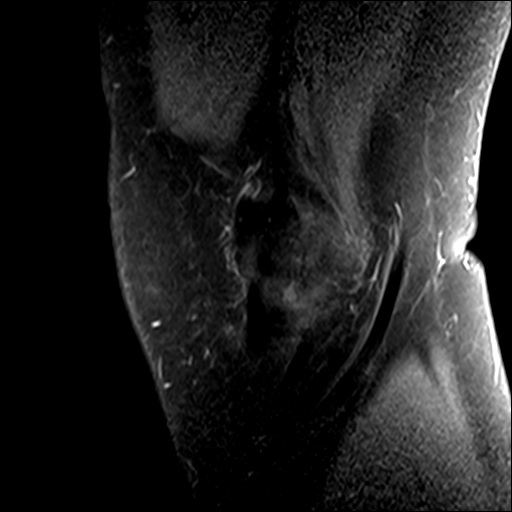
[im 29/29]
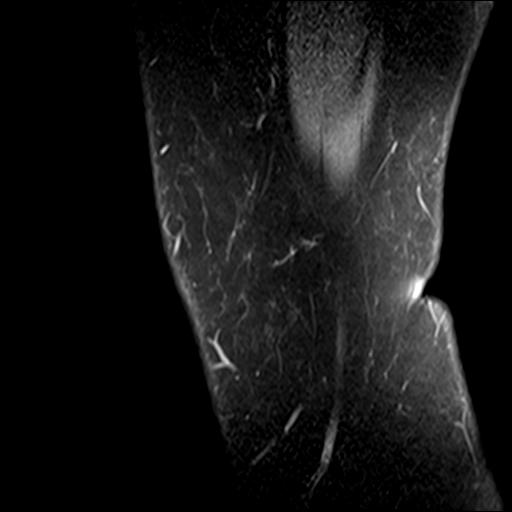

[23 of 40 positions shown; findings below may reference images not displayed]

FINDINGS: MENISCI

Medial: Intact.

Lateral: Intact.

LIGAMENTS

Cruciates: ACL and PCL are intact. ACL ganglion formation
proximally.

Collaterals: Medial collateral ligament is intact. Lateral
collateral ligament complex is intact.

CARTILAGE

Patellofemoral:  Mild chondrosis.

Medial:  No chondral defect.

Lateral:  No chondral defect.

JOINT: No significant joint effusion.

POPLITEAL FOSSA: Miniscule Baker's cyst.

EXTENSOR MECHANISM: Intact quadriceps tendon and patellar tendon.

BONES: No aggressive osseous lesion. No fracture or dislocation.

Other: No fluid collection or hematoma. Muscles are normal.
IMPRESSION: No evidence of meniscus tear.

Proximal ACL ganglion cyst.

Mild patellofemoral chondrosis.

## 2023-04-18 DIAGNOSIS — Z6841 Body Mass Index (BMI) 40.0 and over, adult: Secondary | ICD-10-CM | POA: Diagnosis not present

## 2023-04-18 DIAGNOSIS — F5081 Binge eating disorder: Secondary | ICD-10-CM | POA: Diagnosis not present

## 2023-04-30 ENCOUNTER — Encounter: Payer: Self-pay | Admitting: Gastroenterology

## 2023-04-30 ENCOUNTER — Ambulatory Visit (AMBULATORY_SURGERY_CENTER): Payer: BC Managed Care – PPO

## 2023-04-30 VITALS — Ht 64.0 in | Wt 260.0 lb

## 2023-04-30 DIAGNOSIS — D2360 Other benign neoplasm of skin of unspecified upper limb, including shoulder: Secondary | ICD-10-CM | POA: Insufficient documentation

## 2023-04-30 DIAGNOSIS — L811 Chloasma: Secondary | ICD-10-CM | POA: Insufficient documentation

## 2023-04-30 DIAGNOSIS — D225 Melanocytic nevi of trunk: Secondary | ICD-10-CM | POA: Insufficient documentation

## 2023-04-30 DIAGNOSIS — L820 Inflamed seborrheic keratosis: Secondary | ICD-10-CM | POA: Insufficient documentation

## 2023-04-30 DIAGNOSIS — Z1211 Encounter for screening for malignant neoplasm of colon: Secondary | ICD-10-CM

## 2023-04-30 MED ORDER — NA SULFATE-K SULFATE-MG SULF 17.5-3.13-1.6 GM/177ML PO SOLN
1.0000 | Freq: Once | ORAL | 0 refills | Status: AC
Start: 2023-04-30 — End: 2023-04-30

## 2023-04-30 NOTE — Progress Notes (Signed)
No egg or soy allergy known to patient  No issues known to pt with past sedation with any surgeries or procedures Patient denies ever being told they had issues or difficulty with intubation  No FH of Malignant Hyperthermia Pt is not on diet pills Pt is not on  home 02  Pt is not on blood thinners  Pt denies issues with constipation  No A fib or A flutter Have any cardiac testing pending--no  LOA: independent   Patient's chart reviewed by Cathlyn Parsons CNRA prior to previsit and patient appropriate for the [facility].  Previsit completed and red dot placed by patient's name on their procedure day (on provider's schedule).     PV competed with patient. Prep instructions sent via mychart and home address. Goodrx coupon for [facility] provided to use for price reduction if needed.

## 2023-05-16 DIAGNOSIS — J0100 Acute maxillary sinusitis, unspecified: Secondary | ICD-10-CM | POA: Diagnosis not present

## 2023-05-20 DIAGNOSIS — Z6841 Body Mass Index (BMI) 40.0 and over, adult: Secondary | ICD-10-CM | POA: Diagnosis not present

## 2023-05-20 DIAGNOSIS — Z713 Dietary counseling and surveillance: Secondary | ICD-10-CM | POA: Diagnosis not present

## 2023-05-24 ENCOUNTER — Encounter: Payer: BC Managed Care – PPO | Admitting: Gastroenterology

## 2023-06-04 DIAGNOSIS — Z111 Encounter for screening for respiratory tuberculosis: Secondary | ICD-10-CM | POA: Diagnosis not present

## 2023-06-24 DIAGNOSIS — Z6841 Body Mass Index (BMI) 40.0 and over, adult: Secondary | ICD-10-CM | POA: Diagnosis not present

## 2023-06-24 DIAGNOSIS — Z713 Dietary counseling and surveillance: Secondary | ICD-10-CM | POA: Diagnosis not present

## 2023-07-25 DIAGNOSIS — Z6841 Body Mass Index (BMI) 40.0 and over, adult: Secondary | ICD-10-CM | POA: Diagnosis not present

## 2023-07-25 DIAGNOSIS — Z713 Dietary counseling and surveillance: Secondary | ICD-10-CM | POA: Diagnosis not present

## 2023-08-16 ENCOUNTER — Ambulatory Visit: Payer: BC Managed Care – PPO

## 2023-08-16 VITALS — Ht 64.0 in | Wt 260.0 lb

## 2023-08-16 DIAGNOSIS — Z1211 Encounter for screening for malignant neoplasm of colon: Secondary | ICD-10-CM

## 2023-08-16 NOTE — Progress Notes (Signed)

## 2023-08-20 ENCOUNTER — Other Ambulatory Visit: Payer: Self-pay | Admitting: Family

## 2023-08-20 DIAGNOSIS — Z1231 Encounter for screening mammogram for malignant neoplasm of breast: Secondary | ICD-10-CM

## 2023-08-23 ENCOUNTER — Ambulatory Visit
Admission: RE | Admit: 2023-08-23 | Discharge: 2023-08-23 | Disposition: A | Discharge status: Home / Self Care | Payer: BC Managed Care – PPO | Source: Ambulatory Visit | Attending: Family | Admitting: Family

## 2023-08-23 DIAGNOSIS — Z1231 Encounter for screening mammogram for malignant neoplasm of breast: Secondary | ICD-10-CM

## 2023-08-27 DIAGNOSIS — Z2821 Immunization not carried out because of patient refusal: Secondary | ICD-10-CM | POA: Diagnosis not present

## 2023-08-27 DIAGNOSIS — Z6841 Body Mass Index (BMI) 40.0 and over, adult: Secondary | ICD-10-CM | POA: Diagnosis not present

## 2023-08-27 DIAGNOSIS — Z713 Dietary counseling and surveillance: Secondary | ICD-10-CM | POA: Diagnosis not present

## 2023-08-30 DIAGNOSIS — Z1283 Encounter for screening for malignant neoplasm of skin: Secondary | ICD-10-CM | POA: Diagnosis not present

## 2023-08-30 DIAGNOSIS — D225 Melanocytic nevi of trunk: Secondary | ICD-10-CM | POA: Diagnosis not present

## 2023-08-30 DIAGNOSIS — D485 Neoplasm of uncertain behavior of skin: Secondary | ICD-10-CM | POA: Diagnosis not present

## 2023-08-30 DIAGNOSIS — D2272 Melanocytic nevi of left lower limb, including hip: Secondary | ICD-10-CM | POA: Diagnosis not present

## 2023-09-04 ENCOUNTER — Encounter: Payer: Self-pay | Admitting: Gastroenterology

## 2023-09-05 ENCOUNTER — Telehealth: Payer: Self-pay | Admitting: Gastroenterology

## 2023-09-05 DIAGNOSIS — J22 Unspecified acute lower respiratory infection: Secondary | ICD-10-CM | POA: Diagnosis not present

## 2023-09-05 NOTE — Telephone Encounter (Signed)
Spoke with patient. She is currently at her PCP for upper airway infection.  C/O of bad cough and wheezing. Pt advised it would be best to r/s to give her time to recover and be treated. Pt scheduled for 1st open spot with Dr. Chales Abrahams 11/07/22 at 8:00 AM. New prep instructions have been provided to the patient.

## 2023-09-05 NOTE — Telephone Encounter (Signed)
Inbound call from patient wishing to speak with a nurse in regards to procedure on 11/8 at 9:00 AM. Patient states she has a respiratory infection and would like to discuss continuation of procedure.

## 2023-09-13 ENCOUNTER — Encounter: Payer: BC Managed Care – PPO | Admitting: Gastroenterology

## 2023-09-17 DIAGNOSIS — D485 Neoplasm of uncertain behavior of skin: Secondary | ICD-10-CM | POA: Diagnosis not present

## 2023-09-17 DIAGNOSIS — L988 Other specified disorders of the skin and subcutaneous tissue: Secondary | ICD-10-CM | POA: Diagnosis not present

## 2023-09-26 DIAGNOSIS — Z0000 Encounter for general adult medical examination without abnormal findings: Secondary | ICD-10-CM | POA: Diagnosis not present

## 2023-09-26 DIAGNOSIS — Z23 Encounter for immunization: Secondary | ICD-10-CM | POA: Diagnosis not present

## 2023-09-26 DIAGNOSIS — Z6841 Body Mass Index (BMI) 40.0 and over, adult: Secondary | ICD-10-CM | POA: Diagnosis not present

## 2023-09-26 DIAGNOSIS — Z90710 Acquired absence of both cervix and uterus: Secondary | ICD-10-CM | POA: Diagnosis not present

## 2023-10-27 ENCOUNTER — Encounter: Payer: Self-pay | Admitting: Certified Registered Nurse Anesthetist

## 2023-11-08 ENCOUNTER — Encounter: Payer: Self-pay | Admitting: Gastroenterology

## 2023-11-08 ENCOUNTER — Ambulatory Visit (AMBULATORY_SURGERY_CENTER): Payer: BC Managed Care – PPO | Admitting: Gastroenterology

## 2023-11-08 VITALS — BP 124/79 | HR 88 | Temp 98.1°F | Resp 17 | Ht 64.0 in | Wt 260.0 lb

## 2023-11-08 DIAGNOSIS — Z1211 Encounter for screening for malignant neoplasm of colon: Secondary | ICD-10-CM | POA: Diagnosis not present

## 2023-11-08 DIAGNOSIS — K640 First degree hemorrhoids: Secondary | ICD-10-CM | POA: Diagnosis not present

## 2023-11-08 DIAGNOSIS — K5730 Diverticulosis of large intestine without perforation or abscess without bleeding: Secondary | ICD-10-CM

## 2023-11-08 MED ORDER — SODIUM CHLORIDE 0.9 % IV SOLN: 500.0000 mL | Freq: Once | INTRAVENOUS | Status: DC

## 2023-11-08 NOTE — Progress Notes (Signed)
 [facility] History and Physical   Primary Care Physician:  Glenford Harlene SAILOR, NP   Reason for Procedure:   CRC screening  Plan:    colon     HPI: Nicole Atkinson is a 48 y.o. female    Past Medical History:  Diagnosis Date   Allergy    GERD (gastroesophageal reflux disease)    Hyperlipidemia    IBS (irritable bowel syndrome)     Past Surgical History:  Procedure Laterality Date   ABDOMINAL HYSTERECTOMY     CHOLECYSTECTOMY     COLONOSCOPY  03/18/2006   Minimal internal hemorrhoids. Otherwise normal colonoscopy to terminal ileum    ESOPHAGOGASTRODUODENOSCOPY  06/17/2013   Hiatal Hernia. Irregular Z-line suggestive of gastroesophageal reflux (biopsied).    EYE SURGERY     lscs     x3   SEPTOPLASTY     tummy tuck       Prior to Admission medications   Medication Sig Start Date End Date Taking? Authorizing Provider  pantoprazole (PROTONIX) 40 MG tablet Take 40 mg by mouth daily. 12/28/22  Yes [provider]  pravastatin (PRAVACHOL) 20 MG tablet Take 20 mg by mouth daily. 02/20/23  Yes [provider]  VYVANSE 30 MG capsule Take 30 mg by mouth daily. 04/24/23  Yes [provider]  albuterol  (VENTOLIN  HFA) 108 (90 Base) MCG/ACT inhaler SMARTSIG:2 Puff(s) By Mouth Every 4-6 Hours PRN 09/05/23   [provider]    Current Outpatient Medications  Medication Sig Dispense Refill   pantoprazole (PROTONIX) 40 MG tablet Take 40 mg by mouth daily.     pravastatin (PRAVACHOL) 20 MG tablet Take 20 mg by mouth daily.     VYVANSE 30 MG capsule Take 30 mg by mouth daily.     albuterol  (VENTOLIN  HFA) 108 (90 Base) MCG/ACT inhaler SMARTSIG:2 Puff(s) By Mouth Every 4-6 Hours PRN     Current Facility-Administered Medications  Medication Dose Route Frequency Provider Last Rate Last Admin   0.9 %  sodium chloride  infusion  500 mL Intravenous Once Charlanne Groom, MD        Allergies as of 11/08/2023 - Review Complete 11/08/2023  Allergen  Reaction Noted   Oxycodone-acetaminophen Hives and Itching 05/08/2012   Hydromorphone Itching 01/25/2015    Family History  Problem Relation Age of Onset   Colon polyps Mother    Colon cancer Neg Hx    Esophageal cancer Neg Hx    Rectal cancer Neg Hx    Stomach cancer Neg Hx     Social History   Socioeconomic History   Marital status: Single    Spouse name: Not on file   Number of children: Not on file   Years of education: Not on file   Highest education level: Not on file  Occupational History   Not on file  Tobacco Use   Smoking status: Former    Types: Cigarettes   Smokeless tobacco: Never  Vaping Use   Vaping status: Never Used  Substance and Sexual Activity   Alcohol  use: Yes    Comment: ocassionally/once a week   Drug use: Not Currently   Sexual activity: Not on file  Other Topics Concern   Not on file  Social History Narrative   Not on file   Social Drivers of Health   Financial Resource Strain: Not on file  Food Insecurity: Not on file  Transportation Needs: Not on file  Physical Activity: Not on file  Stress: Not on file  Social  Connections: Not on file  Intimate Partner Violence: Not on file    Review of Systems: Positive for none All other review of systems negative except as mentioned in the HPI.  Physical Exam: Vital signs in last 24 hours: @VSRANGES @   General:   Alert,  Well-developed, well-nourished, pleasant and cooperative in NAD Lungs:  Clear throughout to auscultation.   Heart:  Regular rate and rhythm; no murmurs, clicks, rubs,  or gallops. Abdomen:  Soft, nontender and nondistended. Normal bowel sounds.   Neuro/Psych:  Alert and cooperative. Normal mood and affect. A and O x 3    No significant changes were identified.  The patient continues to be an appropriate candidate for the planned procedure and anesthesia.   Anselm Bring, MD. [facility] 11/08/2023 7:58 AM@

## 2023-11-08 NOTE — Progress Notes (Signed)
 0808 HR > 100 with esmolol 25 mg given IV, MD updated, vss

## 2023-11-08 NOTE — Patient Instructions (Signed)
 Resume all of your previous medications today as ordered.  Read your discharge instructions.  YOU HAD AN ENDOSCOPIC PROCEDURE TODAY AT THE [facility]:   Refer to the procedure report that was given to you for any specific questions about what was found during the examination.  If the procedure report does not answer your questions, please call your gastroenterologist to clarify.  If you requested that your care partner not be given the details of your procedure findings, then the procedure report has been included in a sealed envelope for you to review at your convenience later.  YOU SHOULD EXPECT: Some feelings of bloating in the abdomen. Passage of more gas than usual.  Walking can help get rid of the air that was put into your GI tract during the procedure and reduce the bloating. If you had a lower endoscopy (such as a colonoscopy or flexible sigmoidoscopy) you may notice spotting of blood in your stool or on the toilet paper. If you underwent a bowel prep for your procedure, you may not have a normal bowel movement for a few days.  Please Note:  You might notice some irritation and congestion in your nose or some drainage.  This is from the oxygen used during your procedure.  There is no need for concern and it should clear up in a day or so.  SYMPTOMS TO REPORT IMMEDIATELY:  Following lower endoscopy (colonoscopy or flexible sigmoidoscopy):  Excessive amounts of blood in the stool  Significant tenderness or worsening of abdominal pains  Swelling of the abdomen that is new, acute  Fever of 100F or higher  For urgent or emergent issues, a gastroenterologist can be reached at any hour by calling [phone]. Do not use MyChart messaging for urgent concerns.    DIET:  We do recommend a small meal at first, but then you may proceed to your regular diet.  Drink plenty of fluids but you should avoid alcoholic beverages for 24 hours. Try to increase the fiber in your diet, and  drink plenty of water.  ACTIVITY:  You should plan to take it easy for the rest of today and you should NOT DRIVE or use heavy machinery until tomorrow (because of the sedation medicines used during the test).    FOLLOW UP: Our staff will call the number listed on your records the next business day following your procedure.  We will call around 7:15- 8:00 am to check on you and address any questions or concerns that you may have regarding the information given to you following your procedure. If we do not reach you, we will leave a message.      SIGNATURES/CONFIDENTIALITY: You and/or your care partner have signed paperwork which will be entered into your electronic medical record.  These signatures attest to the fact that that the information above on your After Visit Summary has been reviewed and is understood.  Full responsibility of the confidentiality of this discharge information lies with you and/or your care-partner.

## 2023-11-08 NOTE — Op Note (Signed)
 [facility] Patient Name: Nicole Atkinson Procedure Date: 11/08/2023 7:54 AM MRN: [id number] Endoscopist: Lynnie Bring , MD, [id number] Age: 48 Referring MD:  Date of Birth: Jul 25, 1976 Gender: Female Account #: [id number] Procedure:                Colonoscopy Indications:              Screening for colorectal malignant neoplasm Medicines:                Monitored Anesthesia Care Procedure:                Pre-Anesthesia Assessment:                           - Prior to the procedure, a History and Physical                            was performed, and patient medications and                            allergies were reviewed. The patient's tolerance of                            previous anesthesia was also reviewed. The risks                            and benefits of the procedure and the sedation                            options and risks were discussed with the patient.                            All questions were answered, and informed consent                            was obtained. Prior Anticoagulants: The patient has                            taken no anticoagulant or antiplatelet agents. ASA                            Grade Assessment: III - A patient with severe                            systemic disease. After reviewing the risks and                            benefits, the patient was deemed in satisfactory                            condition to undergo the procedure.                           After obtaining informed consent, the colonoscope  was passed under direct vision. Throughout the                            procedure, the patient's blood pressure, pulse, and                            oxygen saturations were monitored continuously. The                            CF [id number] #[id number] was introduced through the anus                            and advanced to the the cecum, identified by                            appendiceal  orifice and ileocecal valve. The                            colonoscopy was performed without difficulty. The                            patient tolerated the procedure well. The quality                            of the bowel preparation was good. The ileocecal                            valve, appendiceal orifice, and rectum were                            photographed. Scope In: 8:02:38 AM Scope Out: 8:17:19 AM Scope Withdrawal Time: 0 hours 8 minutes 15 seconds  Total Procedure Duration: 0 hours 14 minutes 41 seconds  Findings:                 A few medium-mouthed diverticula were found in the                            sigmoid colon.                           Non-bleeding internal hemorrhoids were found during                            retroflexion. The hemorrhoids were small and Grade                            I (internal hemorrhoids that do not prolapse).                           The exam was otherwise without abnormality on                            direct and retroflexion views. Complications:            No  immediate complications. Estimated Blood Loss:     Estimated blood loss: none. Impression:               - Diverticulosis in the sigmoid colon.                           - Non-bleeding internal hemorrhoids.                           - The examination was otherwise normal on direct                            and retroflexion views.                           - No specimens collected. Recommendation:           - Patient has a contact number available for                            emergencies. The signs and symptoms of potential                            delayed complications were discussed with the                            patient. Return to normal activities tomorrow.                            Written discharge instructions were provided to the                            patient.                           - High fiber diet.                           - Continue  present medications.                           - Repeat colonoscopy in 10 years for screening                            purposes. Earlier, if with any new problems or                            change in family history.                           - The findings and recommendations were discussed                            with the patient's family. Lynnie Bring, MD 11/08/2023 8:21:51 AM This report has been signed electronically.

## 2023-11-08 NOTE — Progress Notes (Signed)
 Report given to PACU, vss

## 2023-11-11 ENCOUNTER — Telehealth: Payer: Self-pay

## 2023-11-11 NOTE — Telephone Encounter (Signed)
  Follow up Call-     11/08/2023    7:33 AM  Call back number  Post procedure Call Back phone  # [phone]  Permission to leave phone message Yes    Attempted to call patient regarding follow-up. No answer, left VM.

## 2023-11-12 DIAGNOSIS — R635 Abnormal weight gain: Secondary | ICD-10-CM | POA: Diagnosis not present

## 2023-11-12 DIAGNOSIS — Z131 Encounter for screening for diabetes mellitus: Secondary | ICD-10-CM | POA: Diagnosis not present

## 2023-11-12 DIAGNOSIS — E782 Mixed hyperlipidemia: Secondary | ICD-10-CM | POA: Diagnosis not present

## 2023-11-12 DIAGNOSIS — E559 Vitamin D deficiency, unspecified: Secondary | ICD-10-CM | POA: Diagnosis not present

## 2023-11-12 DIAGNOSIS — N951 Menopausal and female climacteric states: Secondary | ICD-10-CM | POA: Diagnosis not present

## 2023-11-12 DIAGNOSIS — R5382 Chronic fatigue, unspecified: Secondary | ICD-10-CM | POA: Diagnosis not present

## 2023-11-15 DIAGNOSIS — Z1331 Encounter for screening for depression: Secondary | ICD-10-CM | POA: Diagnosis not present

## 2023-11-15 DIAGNOSIS — R32 Unspecified urinary incontinence: Secondary | ICD-10-CM | POA: Diagnosis not present

## 2023-11-15 DIAGNOSIS — N951 Menopausal and female climacteric states: Secondary | ICD-10-CM | POA: Diagnosis not present

## 2023-11-15 DIAGNOSIS — E782 Mixed hyperlipidemia: Secondary | ICD-10-CM | POA: Diagnosis not present

## 2023-11-15 DIAGNOSIS — M2550 Pain in unspecified joint: Secondary | ICD-10-CM | POA: Diagnosis not present

## 2023-11-28 DIAGNOSIS — Z6841 Body Mass Index (BMI) 40.0 and over, adult: Secondary | ICD-10-CM | POA: Diagnosis not present

## 2023-11-28 DIAGNOSIS — Z713 Dietary counseling and surveillance: Secondary | ICD-10-CM | POA: Diagnosis not present

## 2023-12-17 ENCOUNTER — Ambulatory Visit (HOSPITAL_BASED_OUTPATIENT_CLINIC_OR_DEPARTMENT_OTHER): Payer: BC Managed Care – PPO

## 2023-12-18 ENCOUNTER — Ambulatory Visit (HOSPITAL_BASED_OUTPATIENT_CLINIC_OR_DEPARTMENT_OTHER)
Admission: RE | Admit: 2023-12-18 | Discharge: 2023-12-18 | Disposition: A | Discharge status: Home / Self Care | Payer: BC Managed Care – PPO | Source: Ambulatory Visit | Attending: Family Medicine | Admitting: Family Medicine

## 2023-12-18 ENCOUNTER — Telehealth (HOSPITAL_BASED_OUTPATIENT_CLINIC_OR_DEPARTMENT_OTHER): Payer: Self-pay | Admitting: Family Medicine

## 2023-12-18 ENCOUNTER — Encounter (HOSPITAL_BASED_OUTPATIENT_CLINIC_OR_DEPARTMENT_OTHER): Payer: Self-pay

## 2023-12-18 ENCOUNTER — Encounter (HOSPITAL_BASED_OUTPATIENT_CLINIC_OR_DEPARTMENT_OTHER): Payer: Self-pay | Admitting: Family Medicine

## 2023-12-18 VITALS — BP 161/92 | HR 90 | Temp 97.9°F | Resp 18

## 2023-12-18 DIAGNOSIS — R070 Pain in throat: Secondary | ICD-10-CM | POA: Diagnosis not present

## 2023-12-18 DIAGNOSIS — J020 Streptococcal pharyngitis: Secondary | ICD-10-CM

## 2023-12-18 LAB — POCT RAPID STREP A (OFFICE): Rapid Strep A Screen: POSITIVE — AB

## 2023-12-18 MED ORDER — CEFDINIR 300 MG PO CAPS: 300.0000 mg | ORAL_CAPSULE | Freq: Two times a day (BID) | ORAL | 0 refills | Status: AC

## 2023-12-18 NOTE — Discharge Instructions (Addendum)
Rapid strep is positive.  She is also having a lot of throat pain.  Use acetaminophen or ibuprofen, every 4 hours, as directed on the package, as needed for fevers, body aches, throat pain.  Cefdinir, 300 mg, twice daily for 7 days for tonsillitis.  Get plenty of fluids and rest.    Work excuse provided.  Follow-up if symptoms do not improve, worsen or new symptoms occur.

## 2023-12-18 NOTE — ED Triage Notes (Signed)
Pt c/o sore throat that started on Monday, she reports this morning she has white patches on tonsil.

## 2023-12-18 NOTE — Telephone Encounter (Signed)
Cefdinir was sent in.  I missed sending it in during the visit.  Patient updated.
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
## 2023-12-25 DIAGNOSIS — J029 Acute pharyngitis, unspecified: Secondary | ICD-10-CM | POA: Diagnosis not present

## 2023-12-29 ENCOUNTER — Ambulatory Visit (HOSPITAL_BASED_OUTPATIENT_CLINIC_OR_DEPARTMENT_OTHER): Payer: BC Managed Care – PPO

## 2023-12-30 DIAGNOSIS — J029 Acute pharyngitis, unspecified: Secondary | ICD-10-CM | POA: Diagnosis not present

## 2023-12-31 DIAGNOSIS — R490 Dysphonia: Secondary | ICD-10-CM | POA: Diagnosis not present

## 2023-12-31 DIAGNOSIS — K219 Gastro-esophageal reflux disease without esophagitis: Secondary | ICD-10-CM | POA: Diagnosis not present

## 2023-12-31 DIAGNOSIS — J029 Acute pharyngitis, unspecified: Secondary | ICD-10-CM | POA: Diagnosis not present

## 2023-12-31 DIAGNOSIS — R1310 Dysphagia, unspecified: Secondary | ICD-10-CM | POA: Diagnosis not present

## 2024-01-15 DIAGNOSIS — Z713 Dietary counseling and surveillance: Secondary | ICD-10-CM | POA: Diagnosis not present

## 2024-01-15 DIAGNOSIS — Z6841 Body Mass Index (BMI) 40.0 and over, adult: Secondary | ICD-10-CM | POA: Diagnosis not present

## 2024-02-15 ENCOUNTER — Ambulatory Visit (HOSPITAL_BASED_OUTPATIENT_CLINIC_OR_DEPARTMENT_OTHER)

## 2024-02-18 DIAGNOSIS — Z713 Dietary counseling and surveillance: Secondary | ICD-10-CM | POA: Diagnosis not present

## 2024-02-18 DIAGNOSIS — Z6841 Body Mass Index (BMI) 40.0 and over, adult: Secondary | ICD-10-CM | POA: Diagnosis not present

## 2024-03-18 DIAGNOSIS — K219 Gastro-esophageal reflux disease without esophagitis: Secondary | ICD-10-CM | POA: Diagnosis not present

## 2024-03-18 DIAGNOSIS — R739 Hyperglycemia, unspecified: Secondary | ICD-10-CM | POA: Diagnosis not present

## 2024-03-18 DIAGNOSIS — R000 Tachycardia, unspecified: Secondary | ICD-10-CM | POA: Diagnosis not present

## 2024-03-18 DIAGNOSIS — E7800 Pure hypercholesterolemia, unspecified: Secondary | ICD-10-CM | POA: Diagnosis not present

## 2024-03-31 DIAGNOSIS — J22 Unspecified acute lower respiratory infection: Secondary | ICD-10-CM | POA: Diagnosis not present

## 2024-04-14 DIAGNOSIS — Z6841 Body Mass Index (BMI) 40.0 and over, adult: Secondary | ICD-10-CM | POA: Diagnosis not present

## 2024-04-14 DIAGNOSIS — Z713 Dietary counseling and surveillance: Secondary | ICD-10-CM | POA: Diagnosis not present

## 2024-06-05 DIAGNOSIS — Z713 Dietary counseling and surveillance: Secondary | ICD-10-CM | POA: Diagnosis not present

## 2024-06-05 DIAGNOSIS — Z6841 Body Mass Index (BMI) 40.0 and over, adult: Secondary | ICD-10-CM | POA: Diagnosis not present

## 2024-07-07 DIAGNOSIS — L259 Unspecified contact dermatitis, unspecified cause: Secondary | ICD-10-CM | POA: Diagnosis not present

## 2024-08-04 DIAGNOSIS — J029 Acute pharyngitis, unspecified: Secondary | ICD-10-CM | POA: Diagnosis not present

## 2024-08-04 DIAGNOSIS — Z20818 Contact with and (suspected) exposure to other bacterial communicable diseases: Secondary | ICD-10-CM | POA: Diagnosis not present

## 2024-08-07 DIAGNOSIS — Z713 Dietary counseling and surveillance: Secondary | ICD-10-CM | POA: Diagnosis not present

## 2024-08-07 DIAGNOSIS — Z6841 Body Mass Index (BMI) 40.0 and over, adult: Secondary | ICD-10-CM | POA: Diagnosis not present

## 2024-09-07 ENCOUNTER — Other Ambulatory Visit: Payer: Self-pay | Admitting: Family Medicine

## 2024-09-07 DIAGNOSIS — Z1231 Encounter for screening mammogram for malignant neoplasm of breast: Secondary | ICD-10-CM

## 2024-09-14 ENCOUNTER — Ambulatory Visit
Admission: RE | Admit: 2024-09-14 | Discharge: 2024-09-14 | Disposition: A | Discharge status: Home / Self Care | Source: Ambulatory Visit | Attending: Family Medicine | Admitting: Family Medicine

## 2024-09-14 DIAGNOSIS — Z1231 Encounter for screening mammogram for malignant neoplasm of breast: Secondary | ICD-10-CM

## 2024-09-29 DIAGNOSIS — Z0000 Encounter for general adult medical examination without abnormal findings: Secondary | ICD-10-CM | POA: Diagnosis not present

## 2024-09-29 DIAGNOSIS — Z90710 Acquired absence of both cervix and uterus: Secondary | ICD-10-CM | POA: Diagnosis not present

## 2024-09-29 DIAGNOSIS — Z2821 Immunization not carried out because of patient refusal: Secondary | ICD-10-CM | POA: Diagnosis not present

## 2024-09-29 DIAGNOSIS — Z1389 Encounter for screening for other disorder: Secondary | ICD-10-CM | POA: Diagnosis not present

## 2024-10-06 DIAGNOSIS — Z6841 Body Mass Index (BMI) 40.0 and over, adult: Secondary | ICD-10-CM | POA: Diagnosis not present

## 2024-10-06 DIAGNOSIS — Z713 Dietary counseling and surveillance: Secondary | ICD-10-CM | POA: Diagnosis not present

## 2024-11-02 DIAGNOSIS — R350 Frequency of micturition: Secondary | ICD-10-CM | POA: Diagnosis not present

## 2024-11-02 DIAGNOSIS — L700 Acne vulgaris: Secondary | ICD-10-CM | POA: Diagnosis not present

## 2024-11-02 DIAGNOSIS — N3000 Acute cystitis without hematuria: Secondary | ICD-10-CM | POA: Diagnosis not present
